# Patient Record
Sex: Male | Born: 1985 | Race: White | Hispanic: No | Marital: Single | Smoking: Current some day smoker
Health system: Southern US, Community
[De-identification: ages and names within clinical notes are randomized; demographics above are authoritative.]

---

## 2005-10-07 ENCOUNTER — Emergency Department (HOSPITAL_COMMUNITY): Admission: EM | Admit: 2005-10-07 | Discharge: 2005-10-07 | Payer: Self-pay | Admitting: Emergency Medicine

## 2006-08-22 ENCOUNTER — Emergency Department (HOSPITAL_COMMUNITY): Admission: EM | Admit: 2006-08-22 | Discharge: 2006-08-22 | Payer: Self-pay | Admitting: Emergency Medicine
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
## 2009-07-11 ENCOUNTER — Emergency Department (HOSPITAL_COMMUNITY): Admission: EM | Admit: 2009-07-11 | Discharge: 2009-07-11 | Payer: Self-pay | Admitting: Emergency Medicine

## 2009-11-11 ENCOUNTER — Emergency Department (HOSPITAL_COMMUNITY)
Admission: EM | Admit: 2009-11-11 | Discharge: 2009-11-11 | Payer: Self-pay | Source: Home / Self Care | Admitting: Emergency Medicine

## 2010-04-05 LAB — BASIC METABOLIC PANEL
BUN: 6 mg/dL (ref 6–23)
Calcium: 9.5 mg/dL (ref 8.4–10.5)
Chloride: 101 mEq/L (ref 96–112)
Creatinine, Ser: 0.88 mg/dL (ref 0.4–1.5)

## 2010-04-05 LAB — RAPID URINE DRUG SCREEN, HOSP PERFORMED
Amphetamines: NOT DETECTED
Benzodiazepines: NOT DETECTED
Tetrahydrocannabinol: NOT DETECTED

## 2010-04-05 LAB — ETHANOL

## 2010-04-05 LAB — CBC
HCT: 45.5 % (ref 39.0–52.0)
MCHC: 34.7 g/dL (ref 30.0–36.0)
Platelets: 232 10*3/uL (ref 150–400)
RDW: 12.7 % (ref 11.5–15.5)
WBC: 7.7 10*3/uL (ref 4.0–10.5)

## 2010-04-05 LAB — DIFFERENTIAL
Basophils Absolute: 0 10*3/uL (ref 0.0–0.1)
Basophils Relative: 1 % (ref 0–1)
Eosinophils Relative: 1 % (ref 0–5)
Monocytes Absolute: 0.4 10*3/uL (ref 0.1–1.0)
Neutro Abs: 5.4 10*3/uL (ref 1.7–7.7)

## 2010-04-05 LAB — TRICYCLICS SCREEN, URINE

## 2010-04-09 LAB — DIFFERENTIAL
Basophils Absolute: 0 10*3/uL (ref 0.0–0.1)
Lymphocytes Relative: 29 % (ref 12–46)
Neutro Abs: 6.9 10*3/uL (ref 1.7–7.7)
Neutrophils Relative %: 63 % (ref 43–77)

## 2010-04-09 LAB — BASIC METABOLIC PANEL
BUN: 11 mg/dL (ref 6–23)
Creatinine, Ser: 1.09 mg/dL (ref 0.4–1.5)
GFR calc non Af Amer: >60 mL/min (ref >60)

## 2010-04-09 LAB — CBC
Platelets: 238 10*3/uL (ref 150–400)
RDW: 12.7 % (ref 11.5–15.5)
WBC: 11 10*3/uL — ABNORMAL HIGH (ref 4.0–10.5)

## 2010-04-09 LAB — ETHANOL

## 2010-04-09 LAB — RAPID URINE DRUG SCREEN, HOSP PERFORMED
Amphetamines: NOT DETECTED
Benzodiazepines: NOT DETECTED
Cocaine: NOT DETECTED
Opiates: NOT DETECTED

## 2014-01-03 ENCOUNTER — Emergency Department (HOSPITAL_COMMUNITY)
Admission: EM | Admit: 2014-01-03 | Discharge: 2014-01-03 | Disposition: A | Discharge status: Home / Self Care | Payer: No Typology Code available for payment source | Attending: Emergency Medicine | Admitting: Emergency Medicine

## 2014-01-03 ENCOUNTER — Encounter (HOSPITAL_COMMUNITY): Payer: Self-pay | Admitting: Emergency Medicine

## 2014-01-03 ENCOUNTER — Emergency Department (HOSPITAL_COMMUNITY): Payer: No Typology Code available for payment source

## 2014-01-03 DIAGNOSIS — R079 Chest pain, unspecified: Secondary | ICD-10-CM

## 2014-01-03 DIAGNOSIS — Z880 Allergy status to penicillin: Secondary | ICD-10-CM | POA: Diagnosis not present

## 2014-01-03 DIAGNOSIS — Y92410 Unspecified street and highway as the place of occurrence of the external cause: Secondary | ICD-10-CM | POA: Insufficient documentation

## 2014-01-03 DIAGNOSIS — Z720 Tobacco use: Secondary | ICD-10-CM | POA: Diagnosis not present

## 2014-01-03 DIAGNOSIS — Y9389 Activity, other specified: Secondary | ICD-10-CM | POA: Insufficient documentation

## 2014-01-03 DIAGNOSIS — S29001A Unspecified injury of muscle and tendon of front wall of thorax, initial encounter: Secondary | ICD-10-CM | POA: Diagnosis present

## 2014-01-03 DIAGNOSIS — Y998 Other external cause status: Secondary | ICD-10-CM | POA: Insufficient documentation

## 2014-01-03 MED ORDER — IBUPROFEN 600 MG PO TABS: 600.0000 mg | ORAL_TABLET | Freq: Three times a day (TID) | ORAL | Status: AC | PRN

## 2014-01-03 MED ORDER — KETOROLAC TROMETHAMINE 60 MG/2ML IM SOLN
60.0000 mg | Freq: Once | INTRAMUSCULAR | Status: AC
  Administered 2014-01-03: 60 mg via INTRAMUSCULAR
  Filled 2014-01-03: qty 2

## 2014-01-03 MED ORDER — OXYCODONE-ACETAMINOPHEN 5-325 MG PO TABS
1.0000 | ORAL_TABLET | Freq: Once | ORAL | Status: AC
  Administered 2014-01-03: 1 via ORAL
  Filled 2014-01-03: qty 1
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
## 2014-01-03 NOTE — Discharge Instructions (Signed)
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
## 2016-12-18 ENCOUNTER — Ambulatory Visit (HOSPITAL_COMMUNITY)
Admission: EM | Admit: 2016-12-18 | Discharge: 2016-12-18 | Disposition: A | Discharge status: Home / Self Care | Payer: BLUE CROSS/BLUE SHIELD | Attending: Family Medicine | Admitting: Family Medicine

## 2016-12-18 ENCOUNTER — Encounter (HOSPITAL_COMMUNITY): Payer: Self-pay | Admitting: Family Medicine

## 2016-12-18 DIAGNOSIS — R509 Fever, unspecified: Secondary | ICD-10-CM

## 2016-12-18 DIAGNOSIS — J020 Streptococcal pharyngitis: Secondary | ICD-10-CM | POA: Diagnosis not present

## 2016-12-18 DIAGNOSIS — J029 Acute pharyngitis, unspecified: Secondary | ICD-10-CM

## 2016-12-18 LAB — POCT RAPID STREP A: STREPTOCOCCUS, GROUP A SCREEN (DIRECT): POSITIVE — AB

## 2016-12-18 MED ORDER — CEPHALEXIN 500 MG PO CAPS: 500.0000 mg | ORAL_CAPSULE | Freq: Two times a day (BID) | ORAL | 0 refills | Status: AC

## 2016-12-18 NOTE — ED Provider Notes (Signed)
[facility]    CSN: [id number] Arrival date & time: 12/18/16  1705     History   Chief Complaint Chief Complaint  Patient presents with  . Sore Throat  . Fever    HPI Edward Williams is a 31 y.o. male.   31 year-old male, presenting today complaining of 3 days of sore throat. He has had weakness, subjective fever and chills as well. Has been using Dayquil and Nyquil with mild relief. Denies nasal congestion, cough    The history is provided by the patient.  Sore Throat  This is a new problem. The current episode started more than 2 days ago. The problem occurs constantly. The problem has not changed since onset.Pertinent negatives include no chest pain, no abdominal pain and no shortness of breath. Nothing aggravates the symptoms. Relieved by: OTC meds  He has tried acetaminophen for the symptoms. The treatment provided mild relief.    History reviewed. No pertinent past medical history.  There are no active problems to display for this patient.   History reviewed. No pertinent surgical history.     Home Medications    Prior to Admission medications   Medication Sig Start Date End Date Taking? Authorizing Provider  acetaminophen (TYLENOL) 500 MG tablet Take 1,000 mg by mouth every 6 (six) hours as needed for moderate pain.    [provider]  Aspirin-Salicylamide-Caffeine (BC HEADACHE POWDER PO) Take 1 Package by mouth daily as needed (pain).    [provider]  cephALEXin (KEFLEX) 500 MG capsule Take 1 capsule (500 mg total) by mouth 2 (two) times daily for 10 days. 12/18/16 12/28/16  Sebron Mcmahill C, PA-Williams  ibuprofen (ADVIL,MOTRIN) 600 MG tablet Take 1 tablet (600 mg total) by mouth every 8 (eight) hours as needed. 01/03/14   Azalia Bilisampos, Kevin, MD    Family History History reviewed. No pertinent family history.  Social History Social History   Tobacco Use  . Smoking status: Current Some Day Smoker    Years: 2.00    Types: Cigarettes    . Smokeless tobacco: Never Used  Substance Use Topics  . Alcohol use: Yes    Comment: occasional  . Drug use: No     Allergies   Amoxicillin   Review of Systems Review of Systems  Constitutional: Positive for fever. Negative for chills.  HENT: Positive for sore throat. Negative for ear pain.   Eyes: Negative for pain and visual disturbance.  Respiratory: Negative for cough and shortness of breath.   Cardiovascular: Negative for chest pain and palpitations.  Gastrointestinal: Negative for abdominal pain and vomiting.  Genitourinary: Negative for dysuria and hematuria.  Musculoskeletal: Negative for arthralgias and back pain.  Skin: Negative for color change and rash.  Neurological: Positive for weakness. Negative for seizures and syncope.  All other systems reviewed and are negative.    Physical Exam Triage Vital Signs ED Triage Vitals [12/18/16 1727]  Enc Vitals Group     BP 113/62     Pulse Rate 92     Resp 18     Temp 98 F (36.7 Williams)     Temp src      SpO2 97 %     Weight      Height      Head Circumference      Peak Flow      Pain Score      Pain Loc      Pain Edu?      Excl. in  GC?    No data found.  Updated Vital Signs BP 113/62   Pulse 92   Temp 98 F (36.7 Williams)   Resp 18   SpO2 97%   Visual Acuity Right Eye Distance:   Left Eye Distance:   Bilateral Distance:    Right Eye Near:   Left Eye Near:    Bilateral Near:     Physical Exam  Constitutional: He is oriented to person, place, and time. He appears well-developed and well-nourished.  HENT:  Head: Normocephalic and atraumatic.  Right Ear: Hearing, tympanic membrane, external ear and ear canal normal.  Left Ear: Hearing, tympanic membrane, external ear and ear canal normal.  Nose: Nose normal.  Mouth/Throat: Uvula is midline and mucous membranes are normal. Oropharyngeal exudate, posterior oropharyngeal edema and posterior oropharyngeal erythema present. No tonsillar abscesses.  Eyes:  Conjunctivae are normal.  Neck: Neck supple.  Cardiovascular: Normal rate and regular rhythm.  No murmur heard. Pulmonary/Chest: Effort normal and breath sounds normal. No respiratory distress.  Abdominal: Soft. There is no tenderness.  Musculoskeletal: He exhibits no edema.       Lumbar back: He exhibits tenderness.  Neurological: He is alert and oriented to person, place, and time. He has normal strength and normal reflexes. No cranial nerve deficit or sensory deficit. He displays a negative Romberg sign. GCS eye subscore is 4. GCS verbal subscore is 5. GCS motor subscore is 6.  Skin: Skin is warm and dry.  Psychiatric: He has a normal mood and affect.  Nursing note and vitals reviewed.    UC Treatments / Results  Labs (all labs ordered are listed, but only abnormal results are displayed) Labs Reviewed  POCT RAPID STREP A - Abnormal; Notable for the following components:      Result Value   Streptococcus, Group A Screen (Direct) POSITIVE (*)    All other components within normal limits    EKG  EKG Interpretation None       Radiology No results found.  Procedures Procedures (including critical care time)  Medications Ordered in UC Medications - No data to display   Initial Impression / Assessment and Plan / UC Course  I have reviewed the triage vital signs and the nursing notes.  Pertinent labs & imaging results that were available during my care of the patient were reviewed by me and considered in my medical decision making (see chart for details).     Strep positive PCN allergy, no anaphylaxis - will treat with Keflex   Final Clinical Impressions(s) / UC Diagnoses   Final diagnoses:  Strep pharyngitis    ED Discharge Orders        Ordered    cephALEXin (KEFLEX) 500 MG capsule  2 times daily     12/18/16 1742       Controlled Substance Prescriptions [place] Controlled Substance Registry consulted? Not Applicable   Alecia LemmingBlue, Edward Williams, [place] 12/18/16  1743

## 2016-12-18 NOTE — ED Triage Notes (Signed)
Pt here for sore throat, weakness, fever and weakness.

## 2016-12-18 NOTE — ED Notes (Signed)
Discharged by provider

## 2017-07-04 ENCOUNTER — Encounter (HOSPITAL_COMMUNITY): Payer: Self-pay | Admitting: Emergency Medicine

## 2017-07-04 ENCOUNTER — Other Ambulatory Visit: Payer: Self-pay

## 2017-07-04 DIAGNOSIS — H5789 Other specified disorders of eye and adnexa: Secondary | ICD-10-CM | POA: Diagnosis present

## 2017-07-04 DIAGNOSIS — F17210 Nicotine dependence, cigarettes, uncomplicated: Secondary | ICD-10-CM | POA: Diagnosis not present

## 2017-07-04 DIAGNOSIS — H1033 Unspecified acute conjunctivitis, bilateral: Secondary | ICD-10-CM | POA: Insufficient documentation

## 2017-07-04 NOTE — ED Triage Notes (Signed)
Pt C/O bilateral eye drainage and redness that started this morning.

## 2017-07-05 ENCOUNTER — Emergency Department (HOSPITAL_COMMUNITY)
Admission: EM | Admit: 2017-07-05 | Discharge: 2017-07-05 | Disposition: A | Discharge status: Home / Self Care | Payer: BLUE CROSS/BLUE SHIELD | Attending: Emergency Medicine | Admitting: Emergency Medicine

## 2017-07-05 DIAGNOSIS — H1033 Unspecified acute conjunctivitis, bilateral: Secondary | ICD-10-CM

## 2017-07-05 MED ORDER — POLYMYXIN B-TRIMETHOPRIM 10000-0.1 UNIT/ML-% OP SOLN: 1.0000 [drp] | OPHTHALMIC | 0 refills | Status: AC

## 2017-07-05 NOTE — ED Provider Notes (Signed)
[facility] EMERGENCY DEPARTMENT Provider Note   CSN: [id number] Arrival date & time: 07/04/17  1942     History   Chief Complaint Chief Complaint  Patient presents with  . Eye Pain    HPI Edward Williams is a 32 y.o. male.  Edward Williams is a 32 y.o. Male who is otherwise healthy, presents to the emergency department for evaluation of redness and eye drainage that started yesterday morning.  Patient reports he first noticed symptoms in his right yesterday morning when it started to feel irritated and itchy, then became very red and he started to have lots of yellowish-green drainage, symptoms have since spread to the left eye as well.  He reports eyes feel irritated and sore but not extremely painful.  He denies any changes in vision, has some mild light sensitivity.  No pain with extraocular movements.  Patient wears glasses but does not use contacts.  Has been getting over a viral upper respiratory infection, symptoms are improving.     History reviewed. No pertinent past medical history.  There are no active problems to display for this patient.   History reviewed. No pertinent surgical history.      Home Medications    Prior to Admission medications   Medication Sig Start Date End Date Taking? Authorizing Provider  acetaminophen (TYLENOL) 500 MG tablet Take 1,000 mg by mouth every 6 (six) hours as needed for moderate pain.   Yes [provider]  ibuprofen (ADVIL,MOTRIN) 600 MG tablet Take 1 tablet (600 mg total) by mouth every 8 (eight) hours as needed. 01/03/14  Yes Azalia Bilis, MD  Aspirin-Salicylamide-Caffeine (BC HEADACHE POWDER PO) Take 1 Package by mouth daily as needed (pain).    [provider]    Family History No family history on file.  Social History Social History   Tobacco Use  . Smoking status: Current Some Day Smoker    Years: 2.00    Types: Cigarettes  . Smokeless tobacco: Never Used  Substance Use Topics  . Alcohol use:  Yes    Comment: occasional  . Drug use: No     Allergies   Amoxicillin   Review of Systems Review of Systems  Constitutional: Negative for chills and fever.  Eyes: Positive for photophobia, discharge, redness and itching. Negative for pain and visual disturbance.  Skin: Negative for color change and rash.     Physical Exam Updated Vital Signs BP 116/70 (BP Location: Left Arm)   Pulse 75   Temp 98.2 F (36.8 C) (Oral)   Resp 17   Ht 5\' 9"  (1.753 m)   Wt 88.5 kg (195 lb)   SpO2 97%   BMI 28.80 kg/m   Physical Exam  Constitutional: He appears well-developed and well-nourished. No distress.  HENT:  Head: Normocephalic and atraumatic.  Eyes: Right eye exhibits no discharge. Left eye exhibits no discharge.  Bilateral conjunctive a erythematous and sclera erythematous, yellow-green discharge noted at the inner canthus of the right eye, no proptosis, no periorbital swelling or erythema, PERRLA, EOMs intact  Pulmonary/Chest: Effort normal. No respiratory distress.  Neurological: He is alert. Coordination normal.  Skin: Skin is warm and dry. He is not diaphoretic.  Psychiatric: He has a normal mood and affect. His behavior is normal.  Nursing note and vitals reviewed.    ED Treatments / Results  Labs (all labs ordered are listed, but only abnormal results are displayed) Labs Reviewed - No data to display  EKG None  Radiology  No results found.  Procedures Procedures (including critical care time)  Medications Ordered in ED Medications - No data to display   Initial Impression / Assessment and Plan / ED Course  I have reviewed the triage vital signs and the nursing notes.  Pertinent labs & imaging results that were available during my care of the patient were reviewed by me and considered in my medical decision making (see chart for details).  Edward Williams presents with symptoms consistent with bacterial conjunctivitis.  Purulent discharge exam.  Presentation  non-concerning for iritis, corneal abrasions, or HSV.  No evidence of preseptal or orbital cellulitis.  Pt is not a contact lens wearer.  Patient will be given Polytrim drops.  Personal hygiene and frequent handwashing discussed.  Patient advised to followup with PCP for reevaluation in several days.  Patient verbalizes understanding and is agreeable with discharge.  Final Clinical Impressions(s) / ED Diagnoses   Final diagnoses:  Acute bacterial conjunctivitis of both eyes    ED Discharge Orders        Ordered    trimethoprim-polymyxin b (POLYTRIM) ophthalmic solution  Every 4 hours     07/05/17 0046       Dartha LodgeFord, Meleena Munroe N, PA-C 07/05/17 0056    Gilda CreasePollina, Christopher J, MD 07/05/17 [phone]

## 2017-07-05 NOTE — Discharge Instructions (Signed)
Use antibiotic drops as directed.  You may use artificial tears, or warm or cool compresses for symptomatic relief.  Please follow-up with your regular doctor.  Return for fevers, change in vision, eye pain or swelling around the eye or any other new or concerning symptoms.
